# Patient Record
Sex: Female | Born: 1935 | Race: White | Hispanic: No | Marital: Married
Health system: Southern US, Community
[De-identification: ages and names within clinical notes are randomized; demographics above are authoritative.]

---

## 2009-09-23 ENCOUNTER — Ambulatory Visit: Payer: Self-pay | Admitting: Surgery

## 2009-09-26 ENCOUNTER — Ambulatory Visit: Payer: Self-pay | Admitting: Surgery

## 2011-04-01 IMAGING — CR DG CHEST 1V PORT
1 series · 1 of 1 positions shown · non-contrast
Comparison: none

REASON FOR EXAM: port placement and removal.
COMMENTS:

[view not recorded]
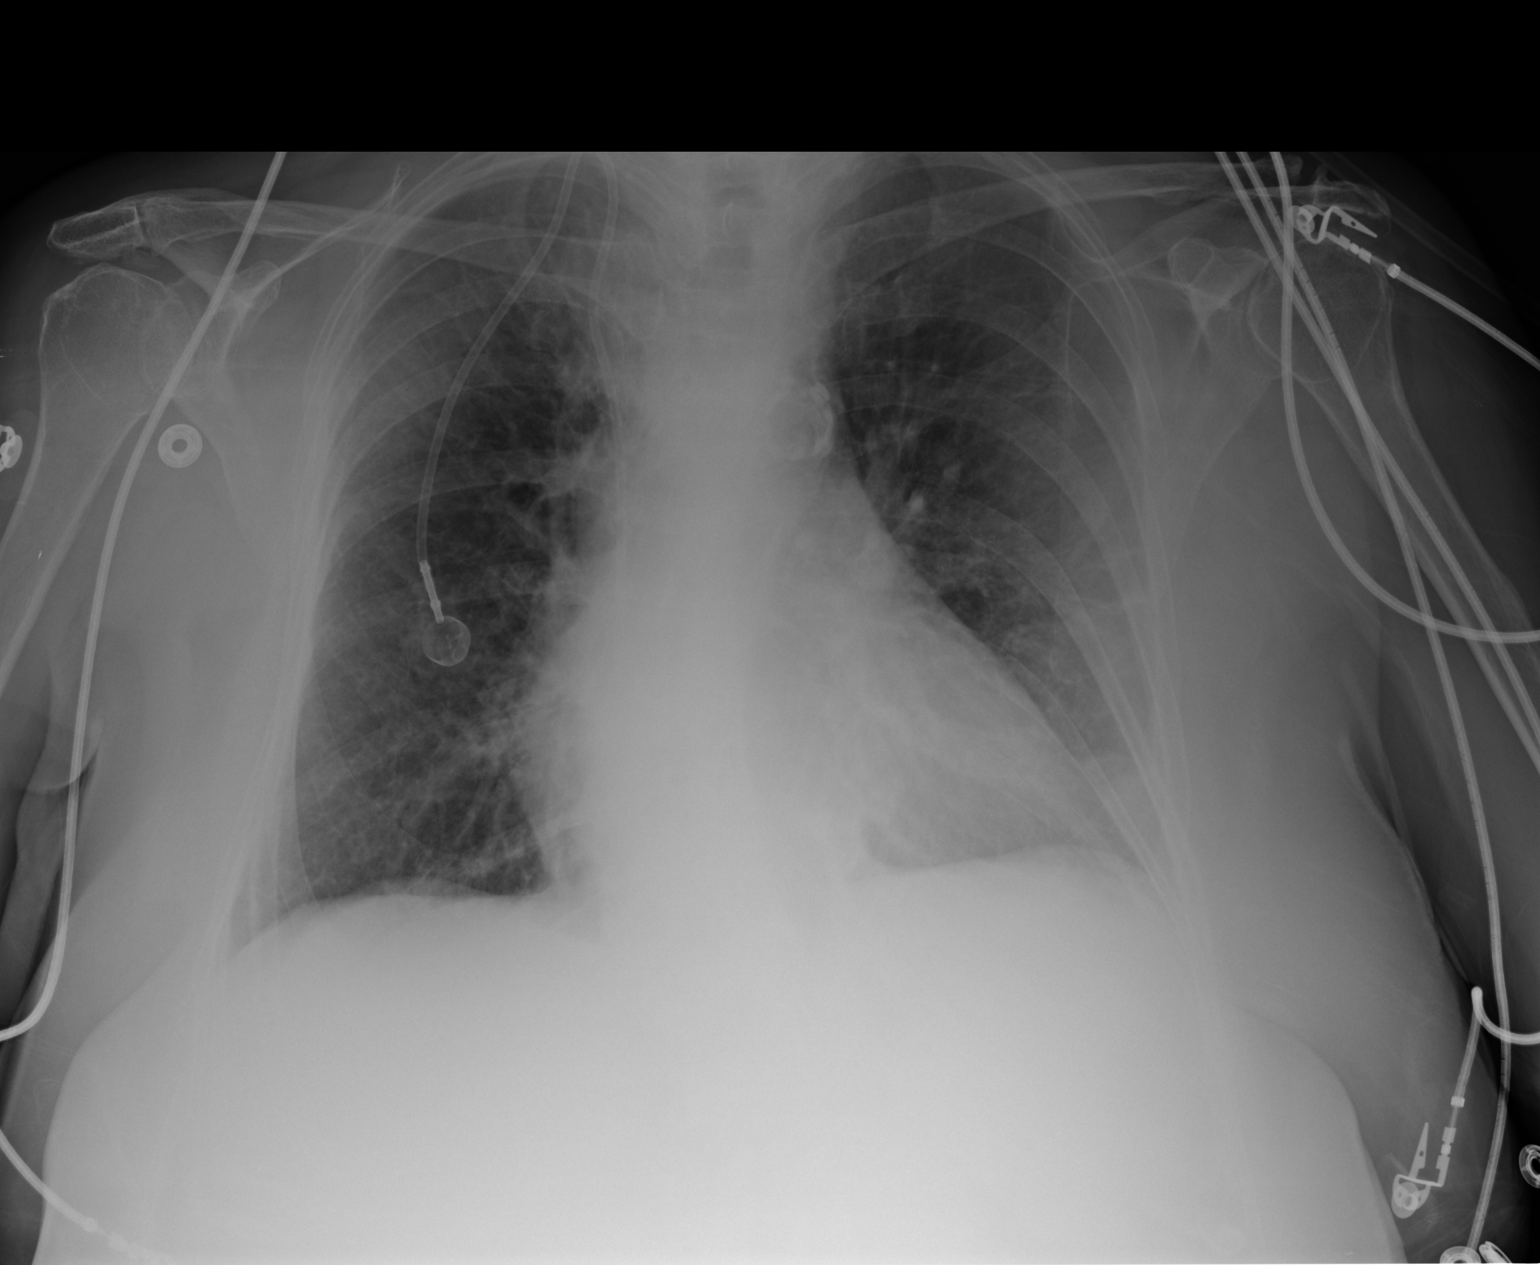

[1 of 1 positions shown; findings below may reference images not displayed]

PROCEDURE:     DXR - DXR PORTABLE CHEST SINGLE VIEW  - September 26, 2009 [DATE]

RESULT:     Portable AP view of the chest shows a Port-A-Cath to be present
with the tip projected over the region of the right atrium. No pneumonia,
pneumothorax or pleural effusion is seen. The heart is upper limits for
normal in size or mildly enlarged. No pleural effusion or frank pulmonary
edema is evident.
IMPRESSION: 1. A Port-A-Cath is present with the tip projected over the upper portion of
the right atrium.
2. No pneumothorax is seen.
3. The heart is upper limits to normal in size or mildly enlarged.

## 2011-05-27 DEATH — deceased

## 2022-10-15 ENCOUNTER — Telehealth: Payer: Self-pay | Admitting: Cardiovascular Disease

## 2022-10-15 NOTE — Telephone Encounter (Signed)
I did not need this encounter. °
